# Patient Record
Sex: Male | Born: 1995 | Race: Black or African American | Hispanic: No | Marital: Single | Smoking: Never smoker
Health system: Southern US, Community
[De-identification: ages and names within clinical notes are randomized; demographics above are authoritative.]

## PROBLEM LIST (undated history)

## (undated) DIAGNOSIS — F909 Attention-deficit hyperactivity disorder, unspecified type: Secondary | ICD-10-CM

## (undated) DIAGNOSIS — M419 Scoliosis, unspecified: Secondary | ICD-10-CM

## (undated) HISTORY — DX: Attention-deficit hyperactivity disorder, unspecified type: F90.9

## (undated) HISTORY — DX: Scoliosis, unspecified: M41.9

---
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
## 2013-03-28 ENCOUNTER — Encounter: Payer: Self-pay | Admitting: Family Medicine

## 2013-04-26 ENCOUNTER — Ambulatory Visit: Payer: Medicaid Other | Admitting: Family Medicine

## 2013-07-30 IMAGING — CR DG CHEST 2V
2 series · 2 of 2 positions shown · non-contrast
Comparison: None.

CLINICAL DATA: Left chest wall deformity with occasional mid chest
pain for several months.  Increasing episodes of shortness of
breath.

CHEST - 2 VIEW

[view not recorded (1 of 2)]
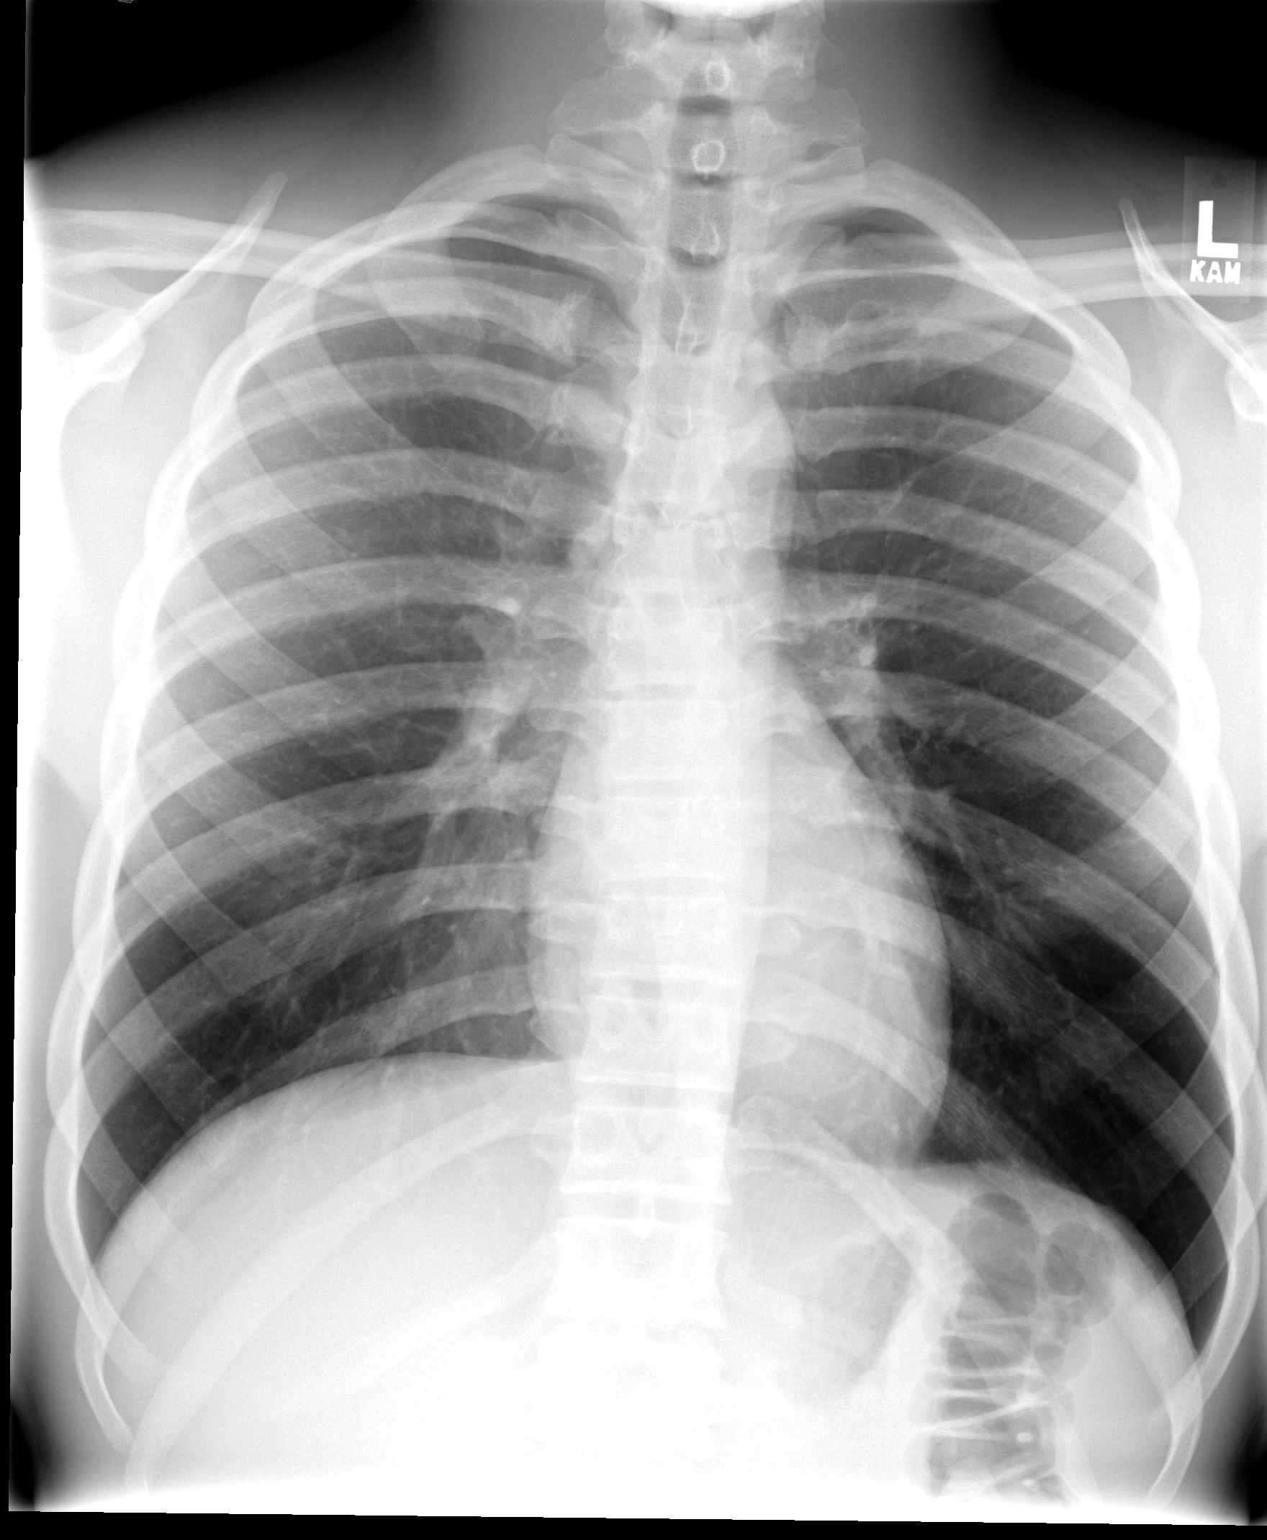

[view not recorded (2 of 2)]
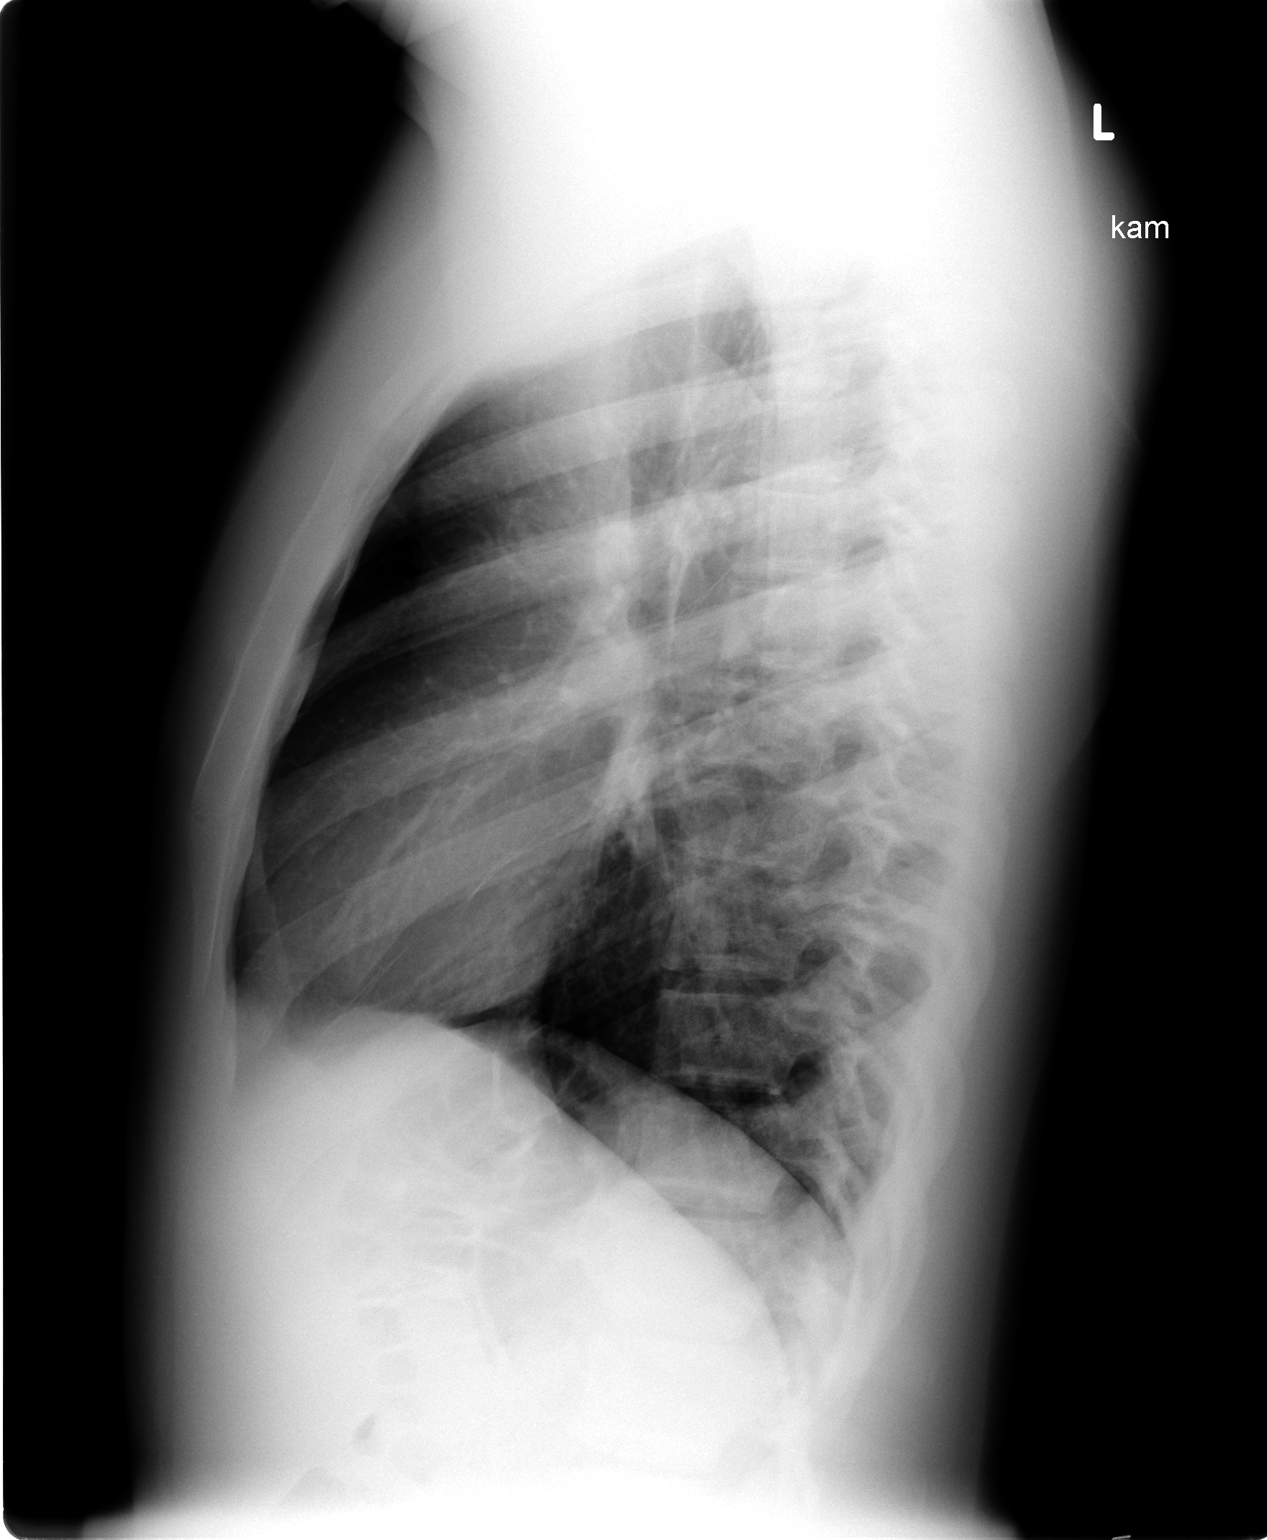

[2 of 2 positions shown; findings below may reference images not displayed]

FINDINGS: Heart and mediastinal contours are within normal limits.
The lung fields appear clear with no signs of focal infiltrate or
congestive failure.  No pleural fluid or significant peribronchial
cuffing is seen.

Bony structures appear intact.  Specifically no radiographic
evidence for thoracic cage asymmetry or focal abnormality is noted.
IMPRESSION: Normal cardiopulmonary appearance.

## 2013-11-24 ENCOUNTER — Telehealth: Payer: Self-pay | Admitting: Family Medicine

## 2013-11-24 NOTE — Telephone Encounter (Signed)
Need to pick up copy of shot records by tomorrow or Monday.  Please call when ready

## 2013-11-25 NOTE — Telephone Encounter (Signed)
Attempted to call patient to inform that shot records are up front for pick up. Both number are non-working
